# Patient Record
Sex: Female | Born: 2005 | Race: White | Hispanic: No | Marital: Single | Smoking: Never smoker
Health system: Southern US, Community
[De-identification: ages and names within clinical notes are randomized; demographics above are authoritative.]

---
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
## 2023-01-01 ENCOUNTER — Ambulatory Visit: Payer: 59 | Admitting: Podiatry

## 2023-01-01 DIAGNOSIS — M722 Plantar fascial fibromatosis: Secondary | ICD-10-CM | POA: Diagnosis not present

## 2023-01-01 NOTE — Progress Notes (Signed)
  Subjective:  Patient ID: Tracy Moss, female    DOB: [birth year],  MRN: [id number]  Chief Complaint  Patient presents with   Numbness    Pt stated she has a lot of numbness and discomfort in heels     17 y.o. female presents with the above complaint.  Patient presents with left heel pain that has been there for quite some time is progressive and worse worse with ambulation or shoe pressure she would like for me to discuss treatment options for her.  She has a lot of numbness and discomfort in the heel she has not seen and was prior to see me pain scale 7 out of 10 dull ache in nature.   Review of Systems: Negative except as noted in the HPI. Denies N/V/F/Ch.  No past medical history on file.  Current Outpatient Medications:    Dapsone 5 % topical gel, Apply topically every morning. (Patient not taking: Reported on 01/26/2021), Disp: , Rfl:    doxycycline (VIBRAMYCIN) 100 MG capsule, Take 100 mg by mouth 2 (two) times daily. (Patient not taking: Reported on 01/26/2021), Disp: , Rfl:    FLUoxetine (PROZAC) 40 MG capsule, TAKE 1 CAPSULE EVERY MORNING, Disp: 90 capsule, Rfl: 3   JUNEL FE 1/20 1-20 MG-MCG tablet, Take 1 tablet by mouth daily., Disp: , Rfl:    [START ON 05/02/2023] Methylphenidate HCl ER, PM, (JORNAY PM) 100 MG CP24, Take 1 capsule (100 mg total) by mouth at bedtime., Disp: 30 capsule, Rfl: 0   Methylphenidate HCl ER, PM, (JORNAY PM) 100 MG CP24, Take 1 capsule (100 mg total) by mouth at bedtime., Disp: 30 capsule, Rfl: 0   Methylphenidate HCl ER, PM, (JORNAY PM) 100 MG CP24, Take 1 capsule (100 mg total) by mouth every evening., Disp: 30 capsule, Rfl: 0  Social History   Tobacco Use  Smoking Status Never  Smokeless Tobacco Never    No Known Allergies Objective:  There were no vitals filed for this visit. There is no height or weight on file to calculate BMI. Constitutional Well developed. Well nourished.  Vascular Dorsalis pedis pulses palpable bilaterally. Posterior  tibial pulses palpable bilaterally. Capillary refill normal to all digits.  No cyanosis or clubbing noted. Pedal hair growth normal.  Neurologic Normal speech. Oriented to person, place, and time. Epicritic sensation to light touch grossly present bilaterally.  Dermatologic Nails well groomed and normal in appearance. No open wounds. No skin lesions.  Orthopedic: Normal joint ROM without pain or crepitus bilaterally. No visible deformities. Tender to palpation at the calcaneal tuber left. No pain with calcaneal squeeze left. Ankle ROM diminished range of motion left. Silfverskiold Test: positive left.   Radiographs: None  Assessment:   1. Plantar fasciitis of left foot    Plan:  Patient was evaluated and treated and all questions answered.  Plantar Fasciitis, left - XR reviewed as above.  - Educated on icing and stretching. Instructions given.  - Injection delivered to the plantar fascia as below. - DME: Plantar fascial brace dispensed to support the medial longitudinal arch of the foot and offload pressure from the heel and prevent arch collapse during weightbearing - Pharmacologic management: None  Procedure: Injection Tendon/Ligament Location: Left plantar fascia at the glabrous junction; medial approach. Skin Prep: alcohol Injectate: 0.5 cc 0.5% marcaine plain, 0.5 cc of 1% Lidocaine, 0.5 cc kenalog 10. Disposition: Patient tolerated procedure well. Injection site dressed with a band-aid.  No follow-ups on file.

## 2023-01-07 ENCOUNTER — Ambulatory Visit: Payer: 59 | Admitting: Physician Assistant

## 2023-01-07 ENCOUNTER — Encounter: Payer: Self-pay | Admitting: Physician Assistant

## 2023-01-07 DIAGNOSIS — F3341 Major depressive disorder, recurrent, in partial remission: Secondary | ICD-10-CM

## 2023-01-07 DIAGNOSIS — F900 Attention-deficit hyperactivity disorder, predominantly inattentive type: Secondary | ICD-10-CM

## 2023-01-07 NOTE — Telephone Encounter (Signed)
PA denied.

## 2023-01-07 NOTE — Progress Notes (Signed)
[facility]  Patient ID: Tracy Moss,  MRN: [id number]  PCP: Pcp, No  Date of Evaluation: 01/07/2023 Time spent:20 minutes  Chief Complaint:  Chief Complaint   ADD; Depression; Follow-up    HISTORY/CURRENT STATUS: HPI for routine med check.    Tracy Moss is doing well. States that attention is good without easy distractibility.  Able to focus on things and finish tasks to completion.   Patient is able to enjoy things.  Energy and motivation are good.  Work is going well.   No extreme sadness, tearfulness, or feelings of hopelessness.  Sleeps well most of the time. ADLs and personal hygiene are normal.  Appetite has not changed.  Weight is stable.  Denies laxative use, calorie restricting, or binging and purging.   Denies cutting or any form of self-harm.  No anxiety.  Denies suicidal or homicidal thoughts.  Patient denies increased energy with decreased need for sleep, increased talkativeness, racing thoughts, impulsivity or risky behaviors, increased spending, increased libido, grandiosity, increased irritability or anger, paranoia, or hallucinations.  Denies dizziness, syncope, seizures, numbness, tingling, tremor, tics, unsteady gait, slurred speech, confusion. Denies muscle or joint pain, stiffness, or dystonia.  Individual Medical History/ Review of Systems: Changes? :No    Past medications for mental health diagnoses include: Lamictal, Prozac, Jornay PM, Concerta  Allergies: Patient has no known allergies.  Current Medications:  Current Outpatient Medications:    celecoxib (CELEBREX) 200 MG capsule, Take by mouth., Disp: , Rfl:    FLUoxetine (PROZAC) 40 MG capsule, TAKE 1 CAPSULE EVERY MORNING, Disp: 90 capsule, Rfl: 3   JUNEL FE 1/20 1-20 MG-MCG tablet, Take 1 tablet by mouth daily., Disp: , Rfl:    methocarbamol (ROBAXIN) 500 MG tablet, Take by mouth., Disp: , Rfl:    [START ON 05/02/2023] Methylphenidate HCl ER, PM, (JORNAY PM) 100 MG CP24, Take 1 capsule  (100 mg total) by mouth at bedtime., Disp: 30 capsule, Rfl: 0   Methylphenidate HCl ER, PM, (JORNAY PM) 100 MG CP24, Take 1 capsule (100 mg total) by mouth at bedtime., Disp: 30 capsule, Rfl: 0   Methylphenidate HCl ER, PM, (JORNAY PM) 100 MG CP24, Take 1 capsule (100 mg total) by mouth every evening., Disp: 30 capsule, Rfl: 0 Medication Side Effects: none  Family Medical/ Social History: Changes?  Working at [employer], also moved into her own apartment.   MENTAL HEALTH EXAM:  There were no vitals taken for this visit.There is no height or weight on file to calculate BMI.  General Appearance: Casual, Well Groomed, and Obese  Eye Contact:  Good  Speech:  Clear and Coherent and Normal Rate  Volume:  Normal  Mood:  Euthymic  Affect:  Congruent  Thought Process:  Goal Directed and Descriptions of Associations: Intact  Orientation:  Full (Time, Place, and Person)  Thought Content: Logical   Suicidal Thoughts:  No  Homicidal Thoughts:  No  Memory:  WNL  Judgement:  Good  Insight:  Good  Psychomotor Activity:  Normal  Concentration:  Concentration: Good and Attention Span: Good  Recall:  Good  Fund of Knowledge: Good  Language: Good  Assets:  Desire for Improvement Financial Resources/Insurance Housing Resilience Transportation Vocational/Educational  ADL's:  Intact  Cognition: WNL  Prognosis:  Good   DIAGNOSES:    ICD-10-CM   1. Attention deficit hyperactivity disorder (ADHD), predominantly inattentive type  F90.0     2. Recurrent major depressive disorder, in partial remission (HCC)  F33.41  Receiving Psychotherapy: No   RECOMMENDATIONS:  PDMP was reviewed.  Last Jornay PM filled 11/12/2022. I provided  20 minutes of face to face time during this encounter, including time spent before and after the visit in records review, medical decision making, counseling pertinent to today's visit, and charting.   She is doing well so no changes in treatment are  necessary.  Continue Prozac 40 mg, 1 p.o. every morning. Continue Jornay PM p.m. 100 mg 1 p.o. nightly. Return in 6 months.  [name], PA-C

## 2023-01-08 NOTE — Telephone Encounter (Signed)
Tracy Moss resubmitted. Awaiting determination.

## 2023-01-08 NOTE — Telephone Encounter (Signed)
I am still receiving a denial for her Tracy Moss with [employer]  A. This medication will be covered if she has tried the preferred drugs on her plan and they did not work well. Amphetamine-dextroamphetamine xr, dexmethylphenidate xr, dextroamphetamine xr, lisdexamfetamine, methylphenidate xr, Azstarys, Requirement in 3 in a class with 3 or more alternative.   B. Provider gives a medical reason she cannot take the other preferred medications listed.

## 2023-01-08 NOTE — Telephone Encounter (Signed)
Please call patient's Mom (if on ROI) to see if she will try one of the other meds. Has only tried Concerta. The main reason we started this several years ago is b/c she needed help getting out of bed and being able to focus right away and for the full 18 waking hours. If pt and/or mom want me to do an appeal, I will, but there is no guarantee insurance will pay for it even with that.

## 2023-01-10 ENCOUNTER — Other Ambulatory Visit: Payer: Self-pay | Admitting: Physician Assistant

## 2023-01-10 MED ORDER — AMPHETAMINE-DEXTROAMPHET ER 30 MG PO CP24: 30.0000 mg | ORAL_CAPSULE | Freq: Two times a day (BID) | ORAL | 0 refills | Status: DC

## 2023-01-10 NOTE — Telephone Encounter (Signed)
Patient notified

## 2023-01-10 NOTE — Telephone Encounter (Signed)
I just sent it. Please let her know she'll take Adderall XR 30 mg q am, and 1 at lunch. If she doesn't need the afternoon dose, she can skip it, but I think she'll need it. Thanks.

## 2023-01-10 NOTE — Telephone Encounter (Signed)
Mom would like to try Adderall XR. If you let me know details I can pend for you.  [facility] [address]

## 2023-01-13 ENCOUNTER — Telehealth: Payer: Self-pay | Admitting: Physician Assistant

## 2023-01-13 NOTE — Telephone Encounter (Signed)
Adderall XR 30mg  needs a PA for BID dose[facility] [address]

## 2023-01-14 NOTE — Telephone Encounter (Signed)
PA submitted for generic Adderall XR see other phone message

## 2023-01-14 NOTE — Telephone Encounter (Signed)
Prior Authorization initiated for Amphetamine-Dextroamphetamine XR 30 mg #60/30 day with [employer],

## 2023-01-15 NOTE — Telephone Encounter (Signed)
[facility] [employer]  approved ADDERALL XR 30mg  from 01/15/23-01/15/2024

## 2023-02-05 ENCOUNTER — Encounter: Payer: Self-pay | Admitting: Podiatry

## 2023-02-05 ENCOUNTER — Ambulatory Visit (INDEPENDENT_AMBULATORY_CARE_PROVIDER_SITE_OTHER): Payer: 59 | Admitting: Podiatry

## 2023-02-05 DIAGNOSIS — Q666 Other congenital valgus deformities of feet: Secondary | ICD-10-CM | POA: Diagnosis not present

## 2023-02-05 DIAGNOSIS — M722 Plantar fascial fibromatosis: Secondary | ICD-10-CM | POA: Diagnosis not present

## 2023-02-05 NOTE — Progress Notes (Signed)
Subjective:  Patient ID: Tracy Moss, female    DOB: 08/05/2005,  MRN: [id number]  Chief Complaint  Patient presents with   Plantar Fasciitis    Pt stated that she is concerned about the numbness     17 y.o. female presents with the above complaint.  Patient presents with complaint of left plantar fasciitis.  He states is doing little bit better.  The injection definitely helped.  She would like to discuss other options she still has a residual pain.  She also would like to do orthotics   Review of Systems: Negative except as noted in the HPI. Denies N/V/F/Ch.  History reviewed. No pertinent past medical history.  Current Outpatient Medications:    Cholecalciferol 1.25 MG (50000 UT) capsule, Take 1 capsule by mouth once a week., Disp: , Rfl:    Fluoxetine HCl, PMDD, 10 MG TABS, Take 1 tablet by mouth daily., Disp: , Rfl:    lamoTRIgine (LAMICTAL XR) 50 MG 24 hour tablet, Take by mouth., Disp: , Rfl:    meloxicam (MOBIC) 15 MG tablet, Take 15 mg by mouth daily., Disp: , Rfl:    methylphenidate 36 MG PO CR tablet, Take by mouth., Disp: , Rfl:    Methylphenidate HCl ER, PM, (JORNAY PM) 100 MG CP24, Take 1 capsule by mouth at bedtime., Disp: , Rfl:    metroNIDAZOLE (METROGEL) 0.75 % vaginal gel, Insert 1 applicatorful every day by vaginal route for 5 days., Disp: , Rfl:    amphetamine-dextroamphetamine (ADDERALL XR) 30 MG 24 hr capsule, Take 1 capsule (30 mg total) by mouth 2 (two) times daily with breakfast and lunch., Disp: 60 capsule, Rfl: 0   celecoxib (CELEBREX) 200 MG capsule, Take by mouth., Disp: , Rfl:    FLUoxetine (PROZAC) 40 MG capsule, TAKE 1 CAPSULE EVERY MORNING, Disp: 90 capsule, Rfl: 3   JUNEL FE 1/20 1-20 MG-MCG tablet, Take 1 tablet by mouth daily., Disp: , Rfl:    methocarbamol (ROBAXIN) 500 MG tablet, Take by mouth., Disp: , Rfl:   Social History   Tobacco Use  Smoking Status Never  Smokeless Tobacco Never    No Known Allergies Objective:  There were no  vitals filed for this visit. There is no height or weight on file to calculate BMI. Constitutional Well developed. Well nourished.  Vascular Dorsalis pedis pulses palpable bilaterally. Posterior tibial pulses palpable bilaterally. Capillary refill normal to all digits.  No cyanosis or clubbing noted. Pedal hair growth normal.  Neurologic Normal speech. Oriented to person, place, and time. Epicritic sensation to light touch grossly present bilaterally.  Dermatologic Nails well groomed and normal in appearance. No open wounds. No skin lesions.  Orthopedic: Normal joint ROM without pain or crepitus bilaterally. No visible deformities. Tender to palpation at the calcaneal tuber left. No pain with calcaneal squeeze left. Ankle ROM diminished range of motion left. Silfverskiold Test: positive left.   Radiographs: None  Assessment:   1. Plantar fasciitis of left foot   2. Pes planovalgus     Plan:  Patient was evaluated and treated and all questions answered.  Plantar Fasciitis, left - XR reviewed as above.  - Educated on icing and stretching. Instructions given.  -Second injection delivered to the plantar fascia as below. - DME: Plantar fascial brace dispensed to support the medial longitudinal arch of the foot and offload pressure from the heel and prevent arch collapse during weightbearing - Pharmacologic management: None  Pes planovalgus -I explained to patient the etiology of pes planovalgus  and relationship with Planter fasciitis and various treatment options were discussed.  Given patient foot structure in the setting of Planter fasciitis I believe patient will benefit from custom-made orthotics to help control the hindfoot motion support the arch of the foot and take the stress away from plantar fascial.  Patient agrees with the plan like to proceed with orthotics -Patient was casted for orthotics  Procedure: Injection Tendon/Ligament Location: Left plantar fascia at the  glabrous junction; medial approach. Skin Prep: alcohol Injectate: 0.5 cc 0.5% marcaine plain, 0.5 cc of 1% Lidocaine, 0.5 cc kenalog 10. Disposition: Patient tolerated procedure well. Injection site dressed with a band-aid.  No follow-ups on file.

## 2023-02-10 ENCOUNTER — Telehealth: Payer: Self-pay | Admitting: Physician Assistant

## 2023-02-10 NOTE — Telephone Encounter (Signed)
Pharmacy has a script, do not have the medication in stock.  LVM to RC.

## 2023-02-10 NOTE — Telephone Encounter (Signed)
Patient called in for refill on Adderall XR 30mg . Ph: [phone] Appt 2/11 [facility]acy [facility] [address] [place], [place]

## 2023-02-11 NOTE — Progress Notes (Signed)
Orthotic order placed for patient today  Addison Bailey Cped, CFo, CFm

## 2023-02-11 NOTE — Telephone Encounter (Signed)
Patient notified she has Rx but pharmacy did not have in stock. Told her she needed to find at another pharmacy and let us know where to send Rx.

## 2023-02-20 NOTE — Telephone Encounter (Signed)
Pt called and wants the adderall xr sent to the [facility] on [address]

## 2023-02-21 NOTE — Telephone Encounter (Signed)
Pt called again today checking the status  of her refill

## 2023-02-25 ENCOUNTER — Other Ambulatory Visit: Payer: Self-pay

## 2023-02-25 MED ORDER — AMPHETAMINE-DEXTROAMPHET ER 30 MG PO CP24: 30.0000 mg | ORAL_CAPSULE | Freq: Two times a day (BID) | ORAL | 0 refills | Status: DC

## 2023-02-25 NOTE — Telephone Encounter (Signed)
Mom, [name],  called today at 2:52 very frustrated that the prescription for Tracy Moss's Adderall has still not been sent in.  This has been going on since 9/16 and now [facility] is completely out of her medication.  Please call to discuss what is going on with the refill.  Appt 07/08/23

## 2023-02-25 NOTE — Telephone Encounter (Signed)
Mom said they had in stock at [facility] on [address] but I didn't see message. Pended to provider today.

## 2023-03-27 ENCOUNTER — Ambulatory Visit: Payer: 59

## 2023-03-28 ENCOUNTER — Other Ambulatory Visit: Payer: Self-pay

## 2023-03-28 ENCOUNTER — Telehealth: Payer: Self-pay | Admitting: Physician Assistant

## 2023-03-28 MED ORDER — AMPHETAMINE-DEXTROAMPHET ER 30 MG PO CP24: 30.0000 mg | ORAL_CAPSULE | Freq: Two times a day (BID) | ORAL | 0 refills | Status: DC

## 2023-03-28 NOTE — Telephone Encounter (Signed)
Pt lvm that she needs a refill on her adderall xr 30 mg. Pharmacy is [facility] on [address]

## 2023-03-28 NOTE — Telephone Encounter (Signed)
Pended.

## 2023-04-02 NOTE — Progress Notes (Signed)
 Patient presents today to pick up custom molded foot orthotics, diagnosed with PF  by Dr. Allena Katz.   Orthotics were dispensed and fit was satisfactory. Reviewed instructions for break-in and wear. Written instructions given to patient.  Patient will follow up as needed.  Tracy Moss CPed, CFo, CFm

## 2023-05-12 ENCOUNTER — Other Ambulatory Visit: Payer: Self-pay

## 2023-05-12 ENCOUNTER — Telehealth: Payer: Self-pay | Admitting: Physician Assistant

## 2023-05-12 MED ORDER — AMPHETAMINE-DEXTROAMPHET ER 30 MG PO CP24: 30.0000 mg | ORAL_CAPSULE | Freq: Two times a day (BID) | ORAL | 0 refills | Status: DC

## 2023-05-12 NOTE — Telephone Encounter (Signed)
Tracy Moss called at 11:07 to request refill of her Adderall XR 30mg .  Appt 07/08/23.  Send to [facility] [id number] [place], [place] - [address]

## 2023-05-12 NOTE — Telephone Encounter (Signed)
PENDED ADDERALL XR 30 MG TO REQUESTED PHARMACY.

## 2023-07-08 ENCOUNTER — Ambulatory Visit: Payer: 59 | Admitting: Physician Assistant

## 2023-07-08 ENCOUNTER — Encounter: Payer: Self-pay | Admitting: Physician Assistant

## 2023-07-08 DIAGNOSIS — F3341 Major depressive disorder, recurrent, in partial remission: Secondary | ICD-10-CM

## 2023-07-08 DIAGNOSIS — F900 Attention-deficit hyperactivity disorder, predominantly inattentive type: Secondary | ICD-10-CM

## 2023-07-08 MED ORDER — FLUOXETINE HCL 40 MG PO CAPS: 40.0000 mg | ORAL_CAPSULE | Freq: Every morning | ORAL | 3 refills | Status: AC

## 2023-07-08 MED ORDER — METHYLPHENIDATE HCL ER (LA) 30 MG PO CP24: 30.0000 mg | ORAL_CAPSULE | Freq: Two times a day (BID) | ORAL | 0 refills | Status: DC

## 2023-07-08 NOTE — Progress Notes (Unsigned)
[facility]  Patient ID: Tracy Moss,  MRN: [id number]  PCP: Pcp, No  Date of Evaluation: 07/08/2023 Time spent:20 minutes  Chief Complaint:  Chief Complaint   ADD; Depression; Follow-up    HISTORY/CURRENT STATUS: HPI for routine med check.    The Jornay PM ended up not being covered by her insurance and was ridiculously expensive.  We changed her to Adderall.  That is not really helping very much.  She still has a lot of trouble staying on task and getting things done.  She gets distracted easily.  She is still working at [employer] and [employer].  Work is going okay.  Patient is able to enjoy things.  Energy and motivation are good.   No extreme sadness, tearfulness, or feelings of hopelessness.  Sleeps well most of the time. ADLs and personal hygiene are normal.   Appetite has not changed.  Weight is stable.  Denies laxative use, calorie restricting, or binging and purging.   Denies cutting or any form of self-harm.  No complaints of anxiety.  Denies suicidal or homicidal thoughts.  Patient denies increased energy with decreased need for sleep, increased talkativeness, racing thoughts, impulsivity or risky behaviors, increased spending, increased libido, grandiosity, increased irritability or anger, paranoia, or hallucinations.  Denies dizziness, syncope, seizures, numbness, tingling, tremor, tics, unsteady gait, slurred speech, confusion. Denies muscle or joint pain, stiffness, or dystonia.  Individual Medical History/ Review of Systems: Changes? :No    Past medications for mental health diagnoses include: Lamictal, Prozac, Jornay PM, Concerta, Adderall was not as effective as [place] PM  Allergies: Patient has no known allergies.  Current Medications:  Current Outpatient Medications:    JUNEL FE 1/20 1-20 MG-MCG tablet, Take 1 tablet by mouth daily., Disp: , Rfl:    methylphenidate (RITALIN LA) 30 MG 24 hr capsule, Take 1 capsule (30 mg total) by mouth 2  (two) times daily with breakfast and lunch., Disp: 60 capsule, Rfl: 0   celecoxib (CELEBREX) 200 MG capsule, Take by mouth. (Patient not taking: Reported on 07/08/2023), Disp: , Rfl:    Cholecalciferol 1.25 MG (50000 UT) capsule, Take 1 capsule by mouth once a week. (Patient not taking: Reported on 07/08/2023), Disp: , Rfl:    FLUoxetine (PROZAC) 40 MG capsule, Take 1 capsule (40 mg total) by mouth every morning., Disp: 90 capsule, Rfl: 3   meloxicam (MOBIC) 15 MG tablet, Take 15 mg by mouth daily. (Patient not taking: Reported on 07/08/2023), Disp: , Rfl:  Medication Side Effects: none  Family Medical/ Social History: Changes?  no  MENTAL HEALTH EXAM:  There were no vitals taken for this visit.There is no height or weight on file to calculate BMI.  General Appearance: Casual, Well Groomed, and Obese  Eye Contact:  Good  Speech:  Clear and Coherent and Normal Rate  Volume:  Normal  Mood:  Euthymic  Affect:  Congruent  Thought Process:  Goal Directed and Descriptions of Associations: Intact  Orientation:  Full (Time, Place, and Person)  Thought Content: Logical   Suicidal Thoughts:  No  Homicidal Thoughts:  No  Memory:  WNL  Judgement:  Good  Insight:  Good  Psychomotor Activity:  Normal  Concentration:  Concentration: Good and Attention Span: Good  Recall:  Good  Fund of Knowledge: Good  Language: Good  Assets:  Communication Skills Desire for Improvement Financial Resources/Insurance Housing Resilience Transportation Vocational/Educational  ADL's:  Intact  Cognition: WNL  Prognosis:  Good   DIAGNOSES:  ICD-10-CM   1. Attention deficit hyperactivity disorder (ADHD), predominantly inattentive type  F90.0     2. Recurrent major depressive disorder, in partial remission (HCC)  F33.41       Receiving Psychotherapy: No   RECOMMENDATIONS:  PDMP was reviewed.  Last Adderall XR filled 05/12/2023. I provided 20 minutes of face to face time during this encounter, including  time spent before and after the visit in records review, medical decision making, counseling pertinent to today's visit, and charting.   The Adderall is not as effective as the [place] PM was.  We discussed changing to a different stimulant.  I recommend Ritalin long-acting.  She will check with her mom to see if the [name] PM is covered on her insurance now and she may want to switch back to that.  It was effective but still expensive even though it was covered by insurance.  She will let me know.  In the meantime I will send in the Ritalin.  Pros and cons were discussed and she accepts.  Continue Prozac 40 mg, 1 p.o. every morning. Start Ritalin LA 30 mg, 1 p.o. at breakfast and 1 p.o. at lunch. Return in 6 months.  [name], PA-C

## 2023-07-10 ENCOUNTER — Telehealth: Payer: Self-pay | Admitting: Physician Assistant

## 2023-07-10 ENCOUNTER — Other Ambulatory Visit: Payer: Self-pay

## 2023-07-10 MED ORDER — METHYLPHENIDATE HCL ER (LA) 30 MG PO CP24: 30.0000 mg | ORAL_CAPSULE | Freq: Two times a day (BID) | ORAL | 0 refills | Status: DC

## 2023-07-10 NOTE — Telephone Encounter (Signed)
Pt called 10:15 requesting methylphenidate 30 mg to be [facility] [address] [place]. [facility] [address] [facility].

## 2023-07-10 NOTE — Telephone Encounter (Signed)
[facility] and [facility].  Ritalin 30 mg.

## 2023-10-07 ENCOUNTER — Ambulatory Visit: Payer: 59 | Admitting: Physician Assistant

## 2023-10-08 ENCOUNTER — Encounter: Payer: Self-pay | Admitting: Physician Assistant

## 2023-10-08 ENCOUNTER — Ambulatory Visit: Admitting: Physician Assistant

## 2023-10-08 DIAGNOSIS — F3341 Major depressive disorder, recurrent, in partial remission: Secondary | ICD-10-CM

## 2023-10-08 DIAGNOSIS — F900 Attention-deficit hyperactivity disorder, predominantly inattentive type: Secondary | ICD-10-CM

## 2023-10-08 MED ORDER — METHYLPHENIDATE HCL ER (OSM) 36 MG PO TBCR: 72.0000 mg | EXTENDED_RELEASE_TABLET | Freq: Every day | ORAL | 0 refills | Status: DC

## 2023-10-08 NOTE — Progress Notes (Signed)
 [facility]  Patient ID: Tracy Moss,  MRN: [id number]  PCP: Pcp, No  Date of Evaluation: 10/08/2023 Time spent:20 minutes  Chief Complaint:  Chief Complaint   ADD; Follow-up    HISTORY/CURRENT STATUS: HPI for routine med check.    Not doing well on the Ritalin . Adderall wasn't effective either. Concerta  helped in the past but noted in 2019, insurance didn't cover. Tracy Moss worked the best, she needs a stimulant to take at night b/c needs help getting out of bed and getting going each morning.  Tracy Moss is extremely expensive now, even w/ insurance. Has trouble focusing and finishing tasks without many distractions.   Patient is able to enjoy things.  Energy and motivation are good.  Work is going well. Works at [employer].  No extreme sadness, tearfulness, or feelings of hopelessness.  Sleeps well most of the time. ADLs and personal hygiene are normal.  Appetite has not changed.  Weight is stable.  No c/o anxiety.  Denies suicidal or homicidal thoughts.  Patient denies increased energy with decreased need for sleep, increased talkativeness, racing thoughts, impulsivity or risky behaviors, increased spending, increased libido, grandiosity, increased irritability or anger, paranoia, or hallucinations.  Denies dizziness, syncope, seizures, numbness, tingling, tremor, tics, unsteady gait, slurred speech, confusion. Denies muscle or joint pain, stiffness, or dystonia.  Individual Medical History/ Review of Systems: Changes? :No    Past medications for mental health diagnoses include: Lamictal, Prozac , Jornay PM , Concerta , Adderall was not as effective as Jornay PM   Allergies: Patient has no known allergies.  Current Medications:  Current Outpatient Medications:    FLUoxetine  (PROZAC ) 40 MG capsule, Take 1 capsule (40 mg total) by mouth every morning., Disp: 90 capsule, Rfl: 3   JUNEL FE 1/20 1-20 MG-MCG tablet, Take 1 tablet by mouth daily., Disp: , Rfl:     methylphenidate  (CONCERTA ) 36 MG PO CR tablet, Take 2 tablets (72 mg total) by mouth daily., Disp: 60 tablet, Rfl: 0   celecoxib (CELEBREX) 200 MG capsule, Take by mouth. (Patient not taking: Reported on 07/08/2023), Disp: , Rfl:    Cholecalciferol 1.25 MG (50000 UT) capsule, Take 1 capsule by mouth once a week. (Patient not taking: Reported on 07/08/2023), Disp: , Rfl:    meloxicam (MOBIC) 15 MG tablet, Take 15 mg by mouth daily. (Patient not taking: Reported on 07/08/2023), Disp: , Rfl:  Medication Side Effects: none  Family Medical/ Social History: Changes?  no  MENTAL HEALTH EXAM:  There were no vitals taken for this visit.There is no height or weight on file to calculate BMI.  General Appearance: Casual, Well Groomed, and Obese  Eye Contact:  Good  Speech:  Clear and Coherent and Normal Rate  Volume:  Normal  Mood:  Euthymic  Affect:  Congruent  Thought Process:  Goal Directed and Descriptions of Associations: Intact  Orientation:  Full (Time, Place, and Person)  Thought Content: Logical   Suicidal Thoughts:  No  Homicidal Thoughts:  No  Memory:  WNL  Judgement:  Good  Insight:  Good  Psychomotor Activity:  Normal  Concentration:  Concentration: Fair and Attention Span: Fair  Recall:  Good  Fund of Knowledge: Good  Language: Good  Assets:  Communication Skills Desire for Improvement Financial Resources/Insurance Housing Resilience Transportation Vocational/Educational  ADL's:  Intact  Cognition: WNL  Prognosis:  Good   DIAGNOSES:    ICD-10-CM   1. Attention deficit hyperactivity disorder (ADHD), predominantly inattentive type  F90.0     2. Recurrent major depressive  disorder, in partial remission (HCC)  F33.41       Receiving Psychotherapy: No   RECOMMENDATIONS:  PDMP was reviewed.  Ritalin  filled 07/10/2023. I provided 20 minutes of face to face time during this encounter, including time spent before and after the visit in records review, medical decision  making, counseling pertinent to today's visit, and charting.   Discussed the stimulants. Even Ritalin  LA isn't as effective as needed, I recommend changing to Concerta , since it was effective in the past.  If it's not, we may be able to get her on patient assistance (note given about this so she can look it up on the website) or I may be able to get assistance from the drug rep.   Continue Prozac  40 mg, 1 every day. Restart Concerta  36 mg, 2 p.o. every morning. Return in 6 to 8 weeks.  Tracy Slocumb, PA-C

## 2023-11-26 ENCOUNTER — Ambulatory Visit: Admitting: Physician Assistant

## 2023-11-26 ENCOUNTER — Encounter: Payer: Self-pay | Admitting: Physician Assistant

## 2023-11-26 DIAGNOSIS — F3342 Major depressive disorder, recurrent, in full remission: Secondary | ICD-10-CM | POA: Diagnosis not present

## 2023-11-26 DIAGNOSIS — F900 Attention-deficit hyperactivity disorder, predominantly inattentive type: Secondary | ICD-10-CM

## 2023-11-26 MED ORDER — METHYLPHENIDATE HCL ER (OSM) 36 MG PO TBCR
36.0000 mg | EXTENDED_RELEASE_TABLET | Freq: Two times a day (BID) | ORAL | 0 refills | Status: AC
Start: 2023-12-25 — End: ?

## 2023-11-26 MED ORDER — METHYLPHENIDATE HCL ER (OSM) 36 MG PO TBCR: 36.0000 mg | EXTENDED_RELEASE_TABLET | Freq: Two times a day (BID) | ORAL | 0 refills | Status: DC

## 2023-11-26 MED ORDER — METHYLPHENIDATE HCL ER (OSM) 36 MG PO TBCR
36.0000 mg | EXTENDED_RELEASE_TABLET | Freq: Two times a day (BID) | ORAL | 0 refills | Status: AC
Start: 2023-11-26 — End: ?

## 2023-11-26 NOTE — Progress Notes (Signed)
 [facility]  Patient ID: Tracy Moss,  MRN: [id number]  PCP: Pcp, No  Date of Evaluation: 11/26/2023 Time spent:20 minutes  Chief Complaint:  Chief Complaint   ADD; Follow-up; Depression; Anxiety    HISTORY/CURRENT STATUS: HPI for routine med check.    We started Concerta  at the last visit.  She is responding very well to it. States that attention is good without easy distractibility.  Able to focus on things and finish tasks to completion.  No side effects from it that she can tell.  She sleeps well.     Patient is able to enjoy things.  Energy and motivation are good.  Work is going well.  Still works at [employer].  Is in school through [place]  [employer]  No extreme sadness, tearfulness, or feelings of hopelessness.  ADLs and personal hygiene are normal.   Appetite has not changed.  Weight is stable.  No reports of anxiety.  No delirium, psychosis, or mania.  Denies suicidal or homicidal thoughts.  Denies dizziness, syncope, seizures, numbness, tingling, tremor, tics, unsteady gait, slurred speech, confusion. Denies muscle or joint pain, stiffness, or dystonia.  Individual Medical History/ Review of Systems: Changes? :No    Past medications for mental health diagnoses include: Lamictal, Prozac , Jornay PM , Concerta , Adderall was not as effective as Jornay PM   Allergies: Patient has no known allergies.  Current Medications:  Current Outpatient Medications:    FLUoxetine  (PROZAC ) 40 MG capsule, Take 1 capsule (40 mg total) by mouth every morning., Disp: 90 capsule, Rfl: 3   JUNEL FE 1/20 1-20 MG-MCG tablet, Take 1 tablet by mouth daily., Disp: , Rfl:    [START ON 12/25/2023] methylphenidate  (CONCERTA ) 36 MG PO CR tablet, Take 1 tablet (36 mg total) by mouth 2 (two) times daily with breakfast and lunch., Disp: 60 tablet, Rfl: 0   methylphenidate  (CONCERTA ) 36 MG PO CR tablet, Take 1 tablet (36 mg total) by mouth 2 (two) times daily with breakfast and lunch.,  Disp: 60 tablet, Rfl: 0   celecoxib (CELEBREX) 200 MG capsule, Take by mouth. (Patient not taking: Reported on 11/26/2023), Disp: , Rfl:    Cholecalciferol 1.25 MG (50000 UT) capsule, Take 1 capsule by mouth once a week. (Patient not taking: Reported on 11/26/2023), Disp: , Rfl:    meloxicam (MOBIC) 15 MG tablet, Take 15 mg by mouth daily. (Patient not taking: Reported on 11/26/2023), Disp: , Rfl:    [START ON 01/23/2024] methylphenidate  (CONCERTA ) 36 MG PO CR tablet, Take 1 tablet (36 mg total) by mouth 2 (two) times daily with breakfast and lunch., Disp: 60 tablet, Rfl: 0 Medication Side Effects: none  Family Medical/ Social History: Changes?  no  MENTAL HEALTH EXAM:  There were no vitals taken for this visit.There is no height or weight on file to calculate BMI.  General Appearance: Casual, Well Groomed, and Obese  Eye Contact:  Good  Speech:  Clear and Coherent and Normal Rate  Volume:  Normal  Mood:  Euthymic  Affect:  Congruent  Thought Process:  Goal Directed and Descriptions of Associations: Intact  Orientation:  Full (Time, Place, and Person)  Thought Content: Logical   Suicidal Thoughts:  No  Homicidal Thoughts:  No  Memory:  WNL  Judgement:  Good  Insight:  Good  Psychomotor Activity:  Normal  Concentration:  Concentration: Good and Attention Span: Good  Recall:  Good  Fund of Knowledge: Good  Language: Good  Assets:  Communication Skills Desire for Improvement Financial Resources/Insurance  Housing Resilience Transportation Vocational/Educational  ADL's:  Intact  Cognition: WNL  Prognosis:  Good   DIAGNOSES:    ICD-10-CM   1. Attention deficit hyperactivity disorder (ADHD), predominantly inattentive type  F90.0     2. Recurrent major depression in full remission (HCC)  F33.42      Receiving Psychotherapy: No   RECOMMENDATIONS:  PDMP was reviewed.  Concerta  filled 10/08/2023. I provided approximately 20 minutes of face to face time during this encounter,  including time spent before and after the visit in records review, medical decision making, counseling pertinent to today's visit, and charting.   Jalisia is doing really well on her current medication regimen so no changes will be made.  Continue Prozac  40 mg, 1 every day. Continue Concerta  36 mg, 2 p.o. every morning. Return in 6 months.   Verneita Cooks, PA-C

## 2024-02-20 ENCOUNTER — Telehealth: Payer: Self-pay | Admitting: Physician Assistant

## 2024-02-20 ENCOUNTER — Other Ambulatory Visit: Payer: Self-pay

## 2024-02-20 DIAGNOSIS — F900 Attention-deficit hyperactivity disorder, predominantly inattentive type: Secondary | ICD-10-CM

## 2024-02-20 NOTE — Telephone Encounter (Signed)
 Pt need rf of Concerta   Appt 05/31/24    [facility] [address]

## 2024-02-20 NOTE — Telephone Encounter (Signed)
 Pt has RF available, notified her.

## 2024-05-31 ENCOUNTER — Ambulatory Visit: Admitting: Physician Assistant

## 2024-06-02 ENCOUNTER — Telehealth: Payer: Self-pay | Admitting: Physician Assistant

## 2024-06-02 NOTE — Telephone Encounter (Signed)
 Patient lvm requesting a refill on the Concerta  36 mg. Appt scheduled for 06/18/24.

## 2024-06-03 ENCOUNTER — Other Ambulatory Visit: Payer: Self-pay

## 2024-06-03 DIAGNOSIS — F900 Attention-deficit hyperactivity disorder, predominantly inattentive type: Secondary | ICD-10-CM

## 2024-06-03 MED ORDER — METHYLPHENIDATE HCL ER (OSM) 36 MG PO TBCR: 36.0000 mg | EXTENDED_RELEASE_TABLET | Freq: Two times a day (BID) | ORAL | 0 refills | Status: AC

## 2024-06-03 NOTE — Telephone Encounter (Signed)
 Pended - [facility]

## 2024-06-18 ENCOUNTER — Ambulatory Visit: Payer: Self-pay | Admitting: Physician Assistant

## 2024-06-18 DIAGNOSIS — Z91199 Patient's noncompliance with other medical treatment and regimen due to unspecified reason: Secondary | ICD-10-CM

## 2024-06-18 NOTE — Progress Notes (Signed)
 No show

## 2024-07-16 ENCOUNTER — Ambulatory Visit: Admitting: Physician Assistant
